# Patient Record
Sex: Male | Born: 1970 | Race: White | Hispanic: No | Marital: Married | Smoking: Current every day smoker
Health system: Southern US, Community
[De-identification: ages and names within clinical notes are randomized; demographics above are authoritative.]

## PROBLEM LIST (undated history)

## (undated) DIAGNOSIS — I639 Cerebral infarction, unspecified: Secondary | ICD-10-CM

---

## 2016-11-12 ENCOUNTER — Telehealth: Payer: Self-pay | Admitting: Cardiovascular Disease

## 2016-11-12 ENCOUNTER — Ambulatory Visit: Payer: Self-pay | Admitting: Cardiovascular Disease

## 2016-11-12 NOTE — Telephone Encounter (Signed)
11/12/16 - [facility] records [facility] for appointment on 11/12/16 @ 1:40pm with Dr. Allyson SabalBerry. [facility]. aib

## 2016-11-13 ENCOUNTER — Telehealth: Payer: Self-pay | Admitting: Cardiovascular Disease

## 2016-11-13 NOTE — Telephone Encounter (Signed)
11/13/16 - [facility] for upcoming appointment on 11/20/16 @ 1:20pm with Dr. Allyson SabalBerry. [facility] aib

## 2016-11-15 ENCOUNTER — Other Ambulatory Visit: Payer: Self-pay

## 2016-11-15 DIAGNOSIS — E876 Hypokalemia: Secondary | ICD-10-CM

## 2016-11-19 LAB — BASIC METABOLIC PANEL
BUN / CREAT RATIO: 11 (ref 9–20)
BUN: 10 mg/dL (ref 6–24)
CALCIUM: 9.4 mg/dL (ref 8.7–10.2)
CHLORIDE: 101 mmol/L (ref 96–106)
CO2: 25 mmol/L (ref 20–29)
Creatinine, Ser: 0.89 mg/dL (ref 0.76–1.27)
GFR calc non Af Amer: 102 mL/min/{1.73_m2} (ref >59)
GFR, EST AFRICAN AMERICAN: 118 mL/min/{1.73_m2} (ref >59)
GLUCOSE: 71 mg/dL (ref 65–99)
POTASSIUM: 4.5 mmol/L (ref 3.5–5.2)
Sodium: 139 mmol/L (ref 134–144)

## 2016-11-20 ENCOUNTER — Encounter: Payer: Self-pay | Admitting: Cardiovascular Disease

## 2016-11-20 ENCOUNTER — Encounter (INDEPENDENT_AMBULATORY_CARE_PROVIDER_SITE_OTHER): Payer: Self-pay

## 2016-11-20 ENCOUNTER — Ambulatory Visit (INDEPENDENT_AMBULATORY_CARE_PROVIDER_SITE_OTHER): Payer: Self-pay | Admitting: Cardiovascular Disease

## 2016-11-20 VITALS — BP 152/92 | HR 65 | Ht 67.0 in | Wt 151.2 lb

## 2016-11-20 DIAGNOSIS — R55 Syncope and collapse: Secondary | ICD-10-CM

## 2016-11-20 DIAGNOSIS — Z720 Tobacco use: Secondary | ICD-10-CM

## 2016-11-20 DIAGNOSIS — I447 Left bundle-branch block, unspecified: Secondary | ICD-10-CM

## 2016-11-20 NOTE — Assessment & Plan Note (Signed)
Mr. Phillip Rodriguez is had 2 episodes of syncope, the first one back in March , The second one on 11/02/16. These were witnessed episodes. He was unconscious for 30 of 45 seconds. There is no seizure activity. He does have left bundle branch block. I'm going to get a 2-D echocardiogram and a three-day event monitor to further evaluate. I have made him aware that he cannot drive for 6 months.

## 2016-11-20 NOTE — Assessment & Plan Note (Signed)
History tobacco abuse smoking one half pack per day for last 30 years recalcitrant 2 risk factor modification

## 2016-11-20 NOTE — Assessment & Plan Note (Signed)
Most likely chronic

## 2016-11-20 NOTE — Patient Instructions (Signed)
Medication Instructions: Your physician recommends that you continue on your current medications as directed. Please refer to the Current Medication list given to you today.    Testing/Procedures: Your physician has recommended that you wear a 30 day event monitor. Event monitors are medical devices that record the heart's electrical activity. Doctors most often us these monitors to diagnose arrhythmias. Arrhythmias are problems with the speed or rhythm of the heartbeat. The monitor is a small, portable device. You can wear one while you do your normal daily activities. This is usually used to diagnose what is causing palpitations/syncope (passing out).  Your physician has requested that you have an echocardiogram. Echocardiography is a painless test that uses sound waves to create images of your heart. It provides your doctor with information about the size and shape of your heart and how well your heart's chambers and valves are working. This procedure takes approximately one hour. There are no restrictions for this procedure.  Follow-Up: Your physician recommends that you schedule a follow-up appointment after testing with Dr. Berry.     

## 2016-11-20 NOTE — Progress Notes (Signed)
     11/20/2016 Phillip Rodriguez   February 28, 1971  [id number]  Primary Physician Gerre Pebblesavis, Sally, PA-C Primary Cardiologist: Runell GessJonathan J Berry MD Roseanne RenoFACP, FACC, FAHA, FSCAI  HPI:  Mr. Phillip Rodriguez is a pleasant 46 year old thin-appearing married Caucasian male father of 4, grandfather of 7 grandchildren is accompanied by his wife Phillip Rodriguez Today.He was referred by Dr. Sedalia Mutaox for cardiovascular evaluation because of witnessed syncope. His cardiovascular sector profile is notable for 50-pack-years of tobacco abuse smoking one and a half packs a day. He does have a family history of heart disease with the mother that died of a myocardial infarction at age 47. He never had a heart attack or stroke. He denies chest pain or shortness of breath. He has first episode back in March of this year and a second one on June 9. This was witnessed. He was unconscious for 30-45 seconds. There is no seizure activity.   No current outpatient prescriptions on file.   No current facility-administered medications for this visit.     Not on File  Social History   Social History  . Marital status: Married    Spouse name: N/A  . Number of children: N/A  . Years of education: N/A   Occupational History  . Not on file.   Social History Main Topics  . Smoking status: Current Every Day Smoker  . Smokeless tobacco: Never Used  . Alcohol use Not on file  . Drug use: Unknown  . Sexual activity: Not on file   Other Topics Concern  . Not on file   Social History Narrative  . No narrative on file     Review of Systems: General: negative for chills, fever, night sweats or weight changes.  Cardiovascular: negative for chest pain, dyspnea on exertion, edema, orthopnea, palpitations, paroxysmal nocturnal dyspnea or shortness of breath Dermatological: negative for rash Respiratory: negative for cough or wheezing Urologic: negative for hematuria Abdominal: negative for nausea, vomiting, diarrhea, bright red blood per rectum, melena, or  hematemesis Neurologic: negative for visual changes, syncope, or dizziness All other systems reviewed and are otherwise negative except as noted above.    Blood pressure (!) 152/92, pulse 65, height 5\' 7"  (1.702 m), weight 151 lb 3.2 oz (68.6 kg).  General appearance: alert and no distress Neck: no adenopathy, no carotid bruit, no JVD, supple, symmetrical, trachea midline and thyroid not enlarged, symmetric, no tenderness/mass/nodules Lungs: clear to auscultation bilaterally Heart: regular rate and rhythm, S1, S2 normal, no murmur, click, rub or gallop Extremities: extremities normal, atraumatic, no cyanosis or edema  EKG Sinus rhythm at 65 with a bundle branch block. I personally reviewed this EKG.  ASSESSMENT AND PLAN:   Tobacco abuse History tobacco abuse smoking one half pack per day for last 30 years recalcitrant 2 risk factor modification  Left bundle branch block Most likely chronic  Syncope Mr. Phillip Rodriguez is had 2 episodes of syncope, the first one back in March , The second one on 11/02/16. These were witnessed episodes. He was unconscious for 30 of 45 seconds. There is no seizure activity. He does have left bundle branch block. I'm going to get a 2-D echocardiogram and a three-day event monitor to further evaluate. I have made him aware that he cannot drive for 6 months.      Runell GessJonathan J. Berry MD FACP,FACC,FAHA, [facility] 11/20/2016 1:50 PM

## 2016-12-04 ENCOUNTER — Ambulatory Visit (HOSPITAL_COMMUNITY): Payer: Self-pay | Attending: Cardiovascular Disease

## 2016-12-04 ENCOUNTER — Other Ambulatory Visit: Payer: Self-pay

## 2016-12-04 DIAGNOSIS — I051 Rheumatic mitral insufficiency: Secondary | ICD-10-CM | POA: Insufficient documentation

## 2016-12-04 DIAGNOSIS — R55 Syncope and collapse: Secondary | ICD-10-CM

## 2016-12-18 ENCOUNTER — Ambulatory Visit: Payer: Self-pay | Admitting: Cardiovascular Disease

## 2017-02-07 ENCOUNTER — Ambulatory Visit: Payer: Self-pay | Admitting: Cardiovascular Disease

## 2017-08-08 DIAGNOSIS — I6789 Other cerebrovascular disease: Secondary | ICD-10-CM

## 2019-01-30 HISTORY — PX: THROMBECTOMY: PRO61

## 2019-09-28 HISTORY — PX: ENDARTERECTOMY: SHX5162

## 2019-12-03 ENCOUNTER — Emergency Department (HOSPITAL_COMMUNITY)
Admission: EM | Admit: 2019-12-03 | Discharge: 2019-12-04 | Disposition: A | Discharge status: Home / Self Care | Payer: BC Managed Care – PPO | Attending: Emergency Medicine | Admitting: Emergency Medicine

## 2019-12-03 ENCOUNTER — Emergency Department (HOSPITAL_COMMUNITY): Payer: BC Managed Care – PPO

## 2019-12-03 ENCOUNTER — Other Ambulatory Visit: Payer: Self-pay

## 2019-12-03 DIAGNOSIS — R404 Transient alteration of awareness: Secondary | ICD-10-CM | POA: Insufficient documentation

## 2019-12-03 DIAGNOSIS — F17210 Nicotine dependence, cigarettes, uncomplicated: Secondary | ICD-10-CM | POA: Diagnosis not present

## 2019-12-03 DIAGNOSIS — R55 Syncope and collapse: Secondary | ICD-10-CM | POA: Diagnosis present

## 2019-12-03 HISTORY — DX: Cerebral infarction, unspecified: I63.9

## 2019-12-03 MED ORDER — SODIUM CHLORIDE 0.9% FLUSH
3.0000 mL | Freq: Once | INTRAVENOUS | Status: AC
  Administered 2019-12-04: 3 mL via INTRAVENOUS

## 2019-12-03 NOTE — ED Triage Notes (Signed)
Pt BIB [facility] from home, pt's wife reports pt had a syncopal episode lasting 5 minutes. EMS reports pt A&O x 4 on their arrival. Pt diagnosed with COVID 11/17/19, pt reports he has been feeling okay until today. C/o some chest discomfort tonight before his syncopal episode, also reports some shortness of breath, but it is not different from his usual. EMS VS: BP 118/70, HR 80, CBG 168

## 2019-12-04 ENCOUNTER — Encounter (HOSPITAL_COMMUNITY): Payer: Self-pay | Admitting: Primary Care

## 2019-12-04 ENCOUNTER — Emergency Department (HOSPITAL_COMMUNITY): Payer: BC Managed Care – PPO

## 2019-12-04 DIAGNOSIS — R569 Unspecified convulsions: Secondary | ICD-10-CM

## 2019-12-04 LAB — BASIC METABOLIC PANEL
Anion gap: 7 (ref 5–15)
BUN: 14 mg/dL (ref 6–20)
CO2: 28 mmol/L (ref 22–32)
Calcium: 8.2 mg/dL — ABNORMAL LOW (ref 8.9–10.3)
Chloride: 102 mmol/L (ref 98–111)
Creatinine, Ser: 1.03 mg/dL (ref 0.61–1.24)
GFR calc Af Amer: >60 mL/min (ref >60)
GFR calc non Af Amer: >60 mL/min (ref >60)
Glucose, Bld: 125 mg/dL — ABNORMAL HIGH (ref 70–99)
Potassium: 4.2 mmol/L (ref 3.5–5.1)
Sodium: 137 mmol/L (ref 135–145)

## 2019-12-04 LAB — TROPONIN I (HIGH SENSITIVITY)
Troponin I (High Sensitivity): 6 ng/L (ref <18)
Troponin I (High Sensitivity): 8 ng/L (ref <18)

## 2019-12-04 LAB — CBC
HCT: 44.3 % (ref 39.0–52.0)
Hemoglobin: 14.6 g/dL (ref 13.0–17.0)
MCH: 30.4 pg (ref 26.0–34.0)
MCHC: 33 g/dL (ref 30.0–36.0)
MCV: 92.3 fL (ref 80.0–100.0)
Platelets: 294 10*3/uL (ref 150–400)
RBC: 4.8 MIL/uL (ref 4.22–5.81)
RDW: 15.4 % (ref 11.5–15.5)
WBC: 13.7 10*3/uL — ABNORMAL HIGH (ref 4.0–10.5)
nRBC: 0 % (ref 0.0–0.2)

## 2019-12-04 MED ORDER — LEVETIRACETAM 500 MG PO TABS
500.0000 mg | ORAL_TABLET | Freq: Two times a day (BID) | ORAL | 1 refills | Status: AC
Start: 2019-12-04 — End: ?

## 2019-12-04 MED ORDER — LEVETIRACETAM IN NACL 1000 MG/100ML IV SOLN
1000.0000 mg | Freq: Once | INTRAVENOUS | Status: AC
  Administered 2019-12-04: 1000 mg via INTRAVENOUS
  Filled 2019-12-04: qty 100

## 2019-12-04 MED ORDER — FENTANYL CITRATE (PF) 100 MCG/2ML IJ SOLN
50.0000 ug | Freq: Once | INTRAMUSCULAR | Status: AC
  Administered 2019-12-04: 50 ug via INTRAVENOUS
  Filled 2019-12-04: qty 2

## 2019-12-04 NOTE — ED Notes (Signed)
Lunch Tray Ordered @ 1039. 

## 2019-12-04 NOTE — ED Provider Notes (Signed)
[facility] EMERGENCY DEPARTMENT Provider Note   CSN: [id number] Arrival date & time: 12/03/19  2308     History Chief Complaint  Patient presents with  . Loss of Consciousness    Phillip Rodriguez is a 49 y.o. male.  The history is provided by the patient and the spouse.  Loss of Consciousness Episode history:  Single Progression:  Improving Chronicity:  New Relieved by:  None tried Worsened by:  Nothing Associated symptoms: dizziness and headaches   Associated symptoms: no fever and no shortness of breath   Patient with previous history of TIA presents with altered mental status. Patient is accompanied by his wife.  She reports that while they were at home he was sitting in a chair he appeared to lose consciousness.  She reports he never closes eyes but he was not responding.  She reports he began drooling on the right side of his mouth.  She reports his right arm began to "draw up "and he started having mild body jerking.  This resolved after 15 seconds.  Soon after he was awake and alert.  EMS arrived approximately 10 to 15 minutes later.  Initial pulse ox was low and blood pressure was also low.  This resolved soon after. No new medications.  No recent vomiting.  He reported heartburn recently after eating, but no active chest pain.  No pleurisy.  No hemoptysis.  He does report recent mild headaches.  He has had recent dizziness has been evaluated in outpatient has been referred to ENT     Patient reports he was diagnosed with COVID-19 last month, but has fully recovered  Patient Active Problem List   Diagnosis Date Noted  . Syncope 11/20/2016  . Left bundle branch block 11/20/2016  . Tobacco abuse 11/20/2016        No family history on file.  Social History   Tobacco Use  . Smoking status: Current Every Day Smoker  . Smokeless tobacco: Never Used  Substance Use Topics  . Alcohol use: Not on file  . Drug use: Not on file    Home  Medications Prior to Admission medications   Not on File    Allergies    Patient has no known allergies.  Review of Systems   Review of Systems  Constitutional: Negative for fever.  Respiratory: Negative for shortness of breath.   Cardiovascular: Positive for syncope.  Neurological: Positive for dizziness and headaches.  All other systems reviewed and are negative.   Physical Exam Updated Vital Signs BP (!) 110/94   Pulse 80   Temp 98.6 F (37 C) (Oral)   Resp 18   Ht 1.676 m (5\' 6" )   Wt 64.9 kg   SpO2 97%   BMI 23.08 kg/m   Physical Exam CONSTITUTIONAL: Well developed/well nourished HEAD: Normocephalic/atraumatic EYES: EOMI/PERRL, no nystagmus, no ptosis ENMT: Mucous membranes moist, poor dentition NECK: supple no meningeal signs CV: S1/S2 noted, no murmurs/rubs/gallops noted LUNGS: Lungs are clear to auscultation bilaterally, no apparent distress ABDOMEN: soft, nontender, no rebound or guarding GU:no cva tenderness NEURO:Awake/alert, face symmetric, no arm or leg drift is noted Cranial nerves 3/4/5/6/12/02/08/11/12 tested and intact Sensation to light touch intact in all extremities EXTREMITIES: pulses normal, full ROM SKIN: warm, color normal PSYCH: no abnormalities of mood noted  ED Results / Procedures / Treatments   Labs (all labs ordered are listed, but only abnormal results are displayed) Labs Reviewed  BASIC METABOLIC PANEL - Abnormal; Notable for the following components:  Result Value   Glucose, Bld 125 (*)    Calcium 8.2 (*)    All other components within normal limits  CBC - Abnormal; Notable for the following components:   WBC 13.7 (*)    All other components within normal limits  TROPONIN I (HIGH SENSITIVITY)  TROPONIN I (HIGH SENSITIVITY)    EKG EKG Interpretation  Date/Time:  Friday December 03 2019 23:17:06 EDT Ventricular Rate:  87 PR Interval:  140 QRS Duration: 128 QT Interval:  382 QTC Calculation: 459 R Axis:   65 Text  Interpretation: Normal sinus rhythm Left bundle branch block Abnormal ECG No previous ECGs available Confirmed by Zadie Rhine ([id number]) on 12/04/2019 5:50:07 AM   Radiology DG Chest 2 View  Result Date: 12/03/2019 CLINICAL DATA:  Syncope, recent COVID-19 diagnosis 11/17/2019 EXAM: CHEST - 2 VIEW COMPARISON:  Radiograph 08/15/2016, CT 11/29/2012 FINDINGS: Accessory azygos fissure. No consolidation, features of edema, pneumothorax, or effusion. Pulmonary vascularity is normally distributed. The cardiomediastinal contours are unremarkable. No acute osseous or soft tissue abnormality. IMPRESSION: No acute cardiopulmonary abnormality. Electronically Signed   By: Kreg Shropshire M.D.   On: 12/03/2019 23:44    Procedures Procedures   Medications Ordered in ED Medications  sodium chloride flush (NS) 0.9 % injection 3 mL (has no administration in time range)    ED Course  I have reviewed the triage vital signs and the nursing notes.  Pertinent labs & imaging results that were available during my care of the patient were reviewed by me and considered in my medical decision making (see chart for details).    MDM Rules/Calculators/A&P                          7:06 AM Patient presents with an episode of alteration of consciousness.  Wife reports that he was drooling and had some movement and jerking of his right side and arm.  It was a very brief episode.  Patient does have extensive history of cerebrovascular disease.  Care everywhere notes: "Mr.Phillip Rodriguezis a 49 y.o.Caucasianmalewith a history significant for TIAswho [facility] [place] [facility] and [facility] received IC tPA and CTA demonstrated L M1 cutoff. Transferred to [facility] for EVT with TICI 2C reperfusion and L ICA stenting with Integrilin due to intra-procedure occlusion s/p angioplasty with Dr. Allayne Stack.His last carotid ultrasound showed right ICA stenosis of 70-99%. He is s/p right CEA on  09/28/19 with Dr. Mayra Neer. Wilson." Patient's blood pressure has been stable here.  No orthostatic changes.  He ambulated without any tachypnea.  No hypoxia. Low suspicion for cardiac dysrhythmia.  No clinical signs of acute PE.  7:36 AM CT head negative.  Discussed with Dr. Amada Jupiter with neurology.  Suspicious for partial seizure.  Neurology team will evaluate the patient.  Signed out to Dr. Linwood Dibbles at shift change Final Clinical Impression(s) / ED Diagnoses Final diagnoses:  Transient alteration of awareness    Rx / DC Orders ED Discharge Orders    None       Zadie Rhine, MD 12/04/19 [phone]

## 2019-12-04 NOTE — ED Provider Notes (Signed)
Patient was assessed by Dr. Amada Jupiter, neurology.  He feels the patient is stable for discharge.  He recommends a gram of Keppra IV prior to discharge and I will write a prescription for the patient take 500 mg p.o. twice daily..  Patient plans to follow-up with [facility] urology.   Linwood Dibbles, MD 12/04/19 1036

## 2019-12-04 NOTE — ED Notes (Signed)
Pt ambulated around room. O2 sats did not drop and heart rate increased 5 BPM. Pt denies Shob or weakness

## 2019-12-04 NOTE — ED Notes (Signed)
AVS reviewed with pt who verbalized understanding. Pt ambulatory out of dept with spouse

## 2019-12-04 NOTE — Consult Note (Addendum)
NEURO HOSPITALIST CONSULT NOTE   Requesting physician: Dr. Lynelle Doctor   Reason for Consult:LOC   History obtained from:  Patient  / wife HPI:                                                                                                                                          Phillip Rodriguez is an 49 y.o. male  With PMH CVA, endarterectomy who presented to [facility] with c/o an episode of LOC  Per wife and patient. Patient has been having dizziness described as light headed sensation since about easter. He had an endarterectomy in may for his carotid stenosis, but he has still been having light headed sensations that he states have been getting worse.  Last night patient was sitting in a chair. He had not been feeling well, but he was speaking to his wife and he suddenly stopped talking mid sentence. Wife says his eyes were wide open and got big as if he was scared. She called EMS. While on the phone his right arm tensed up and began to shake. This lasted maybe 15 seconds. Whole episode maybe a minute. Then he stopped it took a few seconds but he was able to speak shortly after the shaking stopped. This has never happened before. The patient remembers not feeling well, and then coming to after EMS was called. There is a loss of time. Denies family history of seizure,  febrile seizures, or any ned medications. Endorses smoking about 1.2 pack of cigarettes per day, daily ASA 325 and occasional marijuana usage.  CTH: no hemorrhage.  Past Medical History:  Diagnosis Date  . CVA (cerebral vascular accident) [facility])     Past Surgical History:  Procedure Laterality Date  . ENDARTERECTOMY  09/28/2019  . THROMBECTOMY  01/30/2019    History reviewed. No pertinent family history.              Social History:  reports that he has been smoking. He has never used smokeless tobacco. No history on file for alcohol use and drug use.  No Known Allergies  MEDICATIONS:  No current facility-administered medications for this encounter.   Current Outpatient Medications  Medication Sig Dispense Refill  . aspirin 325 MG tablet Take 650 mg by mouth daily.    Marland Kitchen atorvastatin (LIPITOR) 40 MG tablet Take 40 mg by mouth every evening.         ROS:                                                                                                                                       ROS was performed and is negative except as noted in HPI    Blood pressure 119/78, pulse 66, temperature 98.6 F (37 C), temperature source Oral, resp. rate 18, height 5\' 6"  (1.676 m), weight 64.9 kg, SpO2 98 %.   General Examination:                                                                                                       Physical Exam  Constitutional: Appears well-developed and well-nourished.  Psych: Affect appropriate to situation Eyes: Normal external eye and conjunctiva. HENT: Normocephalic, no lesions, without obvious abnormality.   Musculoskeletal-no joint tenderness, deformity or swelling Cardiovascular: Normal rate and regular rhythm.  Respiratory: Effort normal, non-labored breathing saturations WNL GI: Soft.  No distension. There is no tenderness.  Skin: WDI  Neurological Examination Mental Status: Alert, oriented, thought content appropriate.  Speech fluent without evidence of aphasia.  Able to follow  commands without difficulty. Naming intact Cranial Nerves: II:  Visual fields grossly normal,  III,IV, VI: ptosis not present, extra-ocular motions intact bilaterally pupils equal, round, reactive to light and accommodation V,VII: smile symmetric, facial light touch sensation normal bilaterally VIII: hearing normal bilaterally IX,X: uvula rises symmetrically XI: bilateral shoulder shrug XII: midline tongue extension Motor: Right : Upper extremity   5/5 Left:      Upper extremity   5/5  Lower extremity   5/5  Lower extremity   5/5 Tone and bulk:normal tone throughout; no atrophy noted Sensory:  light touch intact throughout, bilaterally Deep Tendon Reflexes: 2+ and symmetric throughout Cerebellar: No ataxia with FNF and HTS Gait: normal gait and station   Lab Results: Basic Metabolic Panel: Recent Labs  Lab 12/03/19 2325  NA 137  K 4.2  CL 102  CO2 28  GLUCOSE 125*  BUN 14  CREATININE 1.03  CALCIUM 8.2*    CBC: Recent Labs  Lab 12/03/19 2325  WBC 13.7*  HGB 14.6  HCT 44.3  MCV 92.3  PLT  294    Imaging: DG Chest 2 View  Result Date: 12/03/2019 CLINICAL DATA:  Syncope, recent COVID-19 diagnosis 11/17/2019 EXAM: CHEST - 2 VIEW COMPARISON:  Radiograph 08/15/2016, CT 11/29/2012 FINDINGS: Accessory azygos fissure. No consolidation, features of edema, pneumothorax, or effusion. Pulmonary vascularity is normally distributed. The cardiomediastinal contours are unremarkable. No acute osseous or soft tissue abnormality. IMPRESSION: No acute cardiopulmonary abnormality. Electronically Signed   By: Kreg Shropshire M.D.   On: 12/03/2019 23:44   CT HEAD WO CONTRAST  Result Date: 12/04/2019 CLINICAL DATA:  Altered mental status EXAM: CT HEAD WITHOUT CONTRAST TECHNIQUE: Contiguous axial images were obtained from the base of the skull through the vertex without intravenous contrast. COMPARISON:  Head CT dated 06/27/2017. FINDINGS: Brain: Ventricles are stable in size and configuration. There is no mass, hemorrhage, edema or other evidence of acute parenchymal abnormality. No extra-axial hemorrhage. Vascular: Chronic calcified atherosclerotic changes of the large vessels at the skull base. No unexpected hyperdense vessel. Skull: Normal. Negative for fracture or focal lesion. Sinuses/Orbits: No acute finding. Other: None. IMPRESSION: Negative head CT. No intracranial mass, hemorrhage or edema. Electronically Signed   By: Bary Richard M.D.   On: 12/04/2019  07:23    Assessment: With PMH CVA, endarterectomy who presented to [facility] with c/o an episode of LOC. 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 Will start patient on keppra and he can f/u with outpatient neurology.   Recommendations: ---Keppra load 1000 mg, followed by keppra 500 mg BID --F/u outpatient neurology --Per [facility] statutes, patients with seizures are not allowed to drive until  they have been seizure-free for six months. Use caution when using heavy equipment or power tools. Avoid working on ladders or at heights. Take showers instead of baths. Ensure the water temperature is not too high on the home water heater. Do not go swimming alone. When caring for infants or small children, sit down when holding, feeding, or changing them to minimize risk of injury to the child in the event you have a seizure.   Also, Maintain good sleep hygiene. Avoid alcohol.    Valentina Lucks, MSN, NP-C [facility] [phone]  Attending neurologist's note to follow  I have seen the patient reviewed the above note.  The description of the event is very classical for seizure, and he is almost a year out from his previous cortical infarct.  Given the presence of clear potential seizure focus, even though this is only a single seizure I would recommend starting antiepileptic therapy at this time.  I discussed that he is not allowed to drive with the patient who expressed understanding.  Ritta Slot, MD [facility] [phone]  If 7pm- 7am, please page neurology on call as listed in AMION.  12/04/2019, 9:07 AM

## 2019-12-04 NOTE — ED Notes (Signed)
Pt to CT

## 2019-12-04 NOTE — Discharge Instructions (Addendum)
Start taking the seizure medications as prescribed.  Follow-up with a neurologist as recommended

## 2019-12-19 ENCOUNTER — Other Ambulatory Visit: Payer: Self-pay

## 2019-12-19 ENCOUNTER — Emergency Department (HOSPITAL_COMMUNITY)
Admission: EM | Admit: 2019-12-19 | Discharge: 2019-12-19 | Disposition: A | Discharge status: Home / Self Care | Payer: Self-pay | Attending: Emergency Medicine | Admitting: Emergency Medicine

## 2019-12-19 DIAGNOSIS — Z8673 Personal history of transient ischemic attack (TIA), and cerebral infarction without residual deficits: Secondary | ICD-10-CM | POA: Insufficient documentation

## 2019-12-19 DIAGNOSIS — R55 Syncope and collapse: Secondary | ICD-10-CM | POA: Insufficient documentation

## 2019-12-19 DIAGNOSIS — F17210 Nicotine dependence, cigarettes, uncomplicated: Secondary | ICD-10-CM | POA: Insufficient documentation

## 2019-12-19 DIAGNOSIS — Z7982 Long term (current) use of aspirin: Secondary | ICD-10-CM | POA: Insufficient documentation

## 2019-12-19 LAB — COMPREHENSIVE METABOLIC PANEL
ALT: 28 U/L (ref 0–44)
AST: 22 U/L (ref 15–41)
Albumin: 3.6 g/dL (ref 3.5–5.0)
Alkaline Phosphatase: 79 U/L (ref 38–126)
Anion gap: 13 (ref 5–15)
BUN: 12 mg/dL (ref 6–20)
CO2: 23 mmol/L (ref 22–32)
Calcium: 8.7 mg/dL — ABNORMAL LOW (ref 8.9–10.3)
Chloride: 105 mmol/L (ref 98–111)
Creatinine, Ser: 1.08 mg/dL (ref 0.61–1.24)
GFR calc Af Amer: >60 mL/min (ref >60)
GFR calc non Af Amer: >60 mL/min (ref >60)
Glucose, Bld: 95 mg/dL (ref 70–99)
Potassium: 4.1 mmol/L (ref 3.5–5.1)
Sodium: 141 mmol/L (ref 135–145)
Total Bilirubin: 0.5 mg/dL (ref 0.3–1.2)
Total Protein: 6.3 g/dL — ABNORMAL LOW (ref 6.5–8.1)

## 2019-12-19 LAB — CBC WITH DIFFERENTIAL/PLATELET
Abs Immature Granulocytes: 0.03 10*3/uL (ref 0.00–0.07)
Basophils Absolute: 0 10*3/uL (ref 0.0–0.1)
Basophils Relative: 0 %
Eosinophils Absolute: 0.1 10*3/uL (ref 0.0–0.5)
Eosinophils Relative: 1 %
HCT: 42.5 % (ref 39.0–52.0)
Hemoglobin: 14 g/dL (ref 13.0–17.0)
Immature Granulocytes: 0 %
Lymphocytes Relative: 17 %
Lymphs Abs: 1.4 10*3/uL (ref 0.7–4.0)
MCH: 30.8 pg (ref 26.0–34.0)
MCHC: 32.9 g/dL (ref 30.0–36.0)
MCV: 93.4 fL (ref 80.0–100.0)
Monocytes Absolute: 0.2 10*3/uL (ref 0.1–1.0)
Monocytes Relative: 2 %
Neutro Abs: 6.8 10*3/uL (ref 1.7–7.7)
Neutrophils Relative %: 80 %
Platelets: 431 10*3/uL — ABNORMAL HIGH (ref 150–400)
RBC: 4.55 MIL/uL (ref 4.22–5.81)
RDW: 15 % (ref 11.5–15.5)
WBC: 8.6 10*3/uL (ref 4.0–10.5)
nRBC: 0 % (ref 0.0–0.2)

## 2019-12-19 LAB — CBG MONITORING, ED: Glucose-Capillary: 105 mg/dL — ABNORMAL HIGH (ref 70–99)

## 2019-12-19 MED ORDER — LACTATED RINGERS IV BOLUS
1000.0000 mL | Freq: Once | INTRAVENOUS | Status: AC
  Administered 2019-12-19: 1000 mL via INTRAVENOUS

## 2019-12-19 NOTE — ED Triage Notes (Signed)
Pt presents via EMS c/o near syncopal episode while working on car outside. Denies LOC. EMS reports pt initially hypotensive however normotensive upon recheck. Did not receive and IV fluid by EMS. PT A&Ox4. Reports weakness.

## 2019-12-19 NOTE — ED Provider Notes (Signed)
[facility] EMERGENCY DEPARTMENT Provider Note   CSN: [id number] Arrival date & time: 12/19/19  1115     History Chief Complaint  Patient presents with  . Weakness    Phillip Rodriguez is a 49 y.o. male.  HPI 49 year old male presents with near syncope.  He has had episodes like this before.  He has previously had a carotid endarterectomy.  He states he chronically has lightheadedness but while he was working on his car and laying flat and repeatedly sitting up and laying back down he eventually became lightheaded to the point he thought he might pass out.  Eventually went inside.  He checked his blood pressure and it was 61/41.  Currently he is feeling better now though still little lightheaded.  He drank 2 cups of coffee today but nothing else this morning.  Has not eaten.  No chest pain, headache.  Past Medical History:  Diagnosis Date  . CVA (cerebral vascular accident) [facility])     Patient Active Problem List   Diagnosis Date Noted  . Syncope 11/20/2016  . Left bundle branch block 11/20/2016  . Tobacco abuse 11/20/2016    Past Surgical History:  Procedure Laterality Date  . ENDARTERECTOMY  09/28/2019  . THROMBECTOMY  01/30/2019       No family history on file.  Social History   Tobacco Use  . Smoking status: Current Every Day Smoker    Packs/day: 1.00    Types: Cigarettes  . Smokeless tobacco: Never Used  Substance Use Topics  . Alcohol use: Not on file  . Drug use: Not on file    Home Medications Prior to Admission medications   Medication Sig Start Date End Date Taking? Authorizing Provider  aspirin 325 MG tablet Take 650 mg by mouth daily. 09/29/19  Yes [provider]  atorvastatin (LIPITOR) 40 MG tablet Take 40 mg by mouth every evening.  09/27/19  Yes [provider]  HYDROcodone-acetaminophen (NORCO/VICODIN) 5-325 MG tablet Take 1-2 tablets by mouth every 6 (six) hours as needed for pain. 09/29/19 03/27/20 Yes [provider]  levETIRAcetam (KEPPRA) 500 MG tablet Take 1 tablet (500 mg total) by mouth 2 (two) times daily. 12/04/19  Yes Linwood Dibbles, MD    Allergies    Patient has no known allergies.  Review of Systems   Review of Systems  Cardiovascular: Negative for chest pain and palpitations.  Gastrointestinal: Positive for nausea. Negative for vomiting.  Neurological: Positive for light-headedness. Negative for syncope and headaches.  All other systems reviewed and are negative.   Physical Exam Updated Vital Signs BP 115/79 (BP Location: Left Arm)   Pulse 78   Temp 98.5 F (36.9 C) (Oral)   Resp 13   SpO2 100%   Physical Exam Vitals and nursing note reviewed.  Constitutional:      Appearance: He is well-developed.  HENT:     Head: Normocephalic and atraumatic.     Right Ear: External ear normal.     Left Ear: External ear normal.     Nose: Nose normal.     Mouth/Throat:     Mouth: Mucous membranes are dry.  Eyes:     General:        Right eye: No discharge.        Left eye: No discharge.     Extraocular Movements: Extraocular movements intact.     Pupils: Pupils are equal, round, and reactive to light.  Cardiovascular:     Rate and Rhythm:  Normal rate and regular rhythm.     Heart sounds: Normal heart sounds. No murmur heard.   Pulmonary:     Effort: Pulmonary effort is normal.     Breath sounds: Normal breath sounds.  Abdominal:     Palpations: Abdomen is soft.     Tenderness: There is no abdominal tenderness.  Musculoskeletal:     Cervical back: Neck supple.  Skin:    General: Skin is warm and dry.  Neurological:     Mental Status: He is alert and oriented to person, place, and time.     Comments: CN 3-12 grossly intact. 5/5 strength in all 4 extremities. Grossly normal sensation. Normal finger to nose.  Psychiatric:        Mood and Affect: Mood is not anxious.     ED Results / Procedures / Treatments   Labs (all labs ordered are listed, but only abnormal  results are displayed) Labs Reviewed  COMPREHENSIVE METABOLIC PANEL - Abnormal; Notable for the following components:      Result Value   Calcium 8.7 (*)    Total Protein 6.3 (*)    All other components within normal limits  CBC WITH DIFFERENTIAL/PLATELET - Abnormal; Notable for the following components:   Platelets 431 (*)    All other components within normal limits  CBG MONITORING, ED - Abnormal; Notable for the following components:   Glucose-Capillary 105 (*)    All other components within normal limits    EKG EKG Interpretation  Date/Time:  Sunday December 19 2019 14:38:42 EDT Ventricular Rate:  68 PR Interval:    QRS Duration: 136 QT Interval:  441 QTC Calculation: 469 R Axis:   74 Text Interpretation: Sinus rhythm Probable left ventricular hypertrophy Anterior Q waves, possibly due to LVH similar to December 03 2019 Confirmed by Pricilla Loveless [phone]) on 12/19/2019 2:48:33 PM   Radiology No results found.  Procedures Procedures (including critical care time)  Medications Ordered in ED Medications  lactated ringers bolus 1,000 mL (1,000 mLs Intravenous New Bag/Given 12/19/19 1226)  lactated ringers bolus 1,000 mL (1,000 mLs Intravenous New Bag/Given 12/19/19 1229)    ED Course  I have reviewed the triage vital signs and the nursing notes.  Pertinent labs & imaging results that were available during my care of the patient were reviewed by me and considered in my medical decision making (see chart for details).    MDM Rules/Calculators/A&P                          By history patient sounds dehydrated as he has only had a couple cups of coffee today with no food or drink.  He was also working outside as well.  He has been having recurrent syncopal episodes.  Labs are reviewed and unremarkable.  He is feeling better with IV fluids.  No concerning signs/symptoms such as severe headache, neuro symptoms, or chest pain.  I do not think emergent syncopal work-up is otherwise  needed in the hospital and he can follow-up with his PCP. Final Clinical Impression(s) / ED Diagnoses Final diagnoses:  Near syncope    Rx / DC Orders ED Discharge Orders    None       Pricilla Loveless, MD 12/19/19 1534

## 2022-03-20 IMAGING — CT CT HEAD W/O CM
3 series · 16 of 47 positions shown, 19 images · non-contrast
Comparison: Head CT dated 06/27/2017.

CLINICAL DATA: Altered mental status

EXAM:
CT HEAD WITHOUT CONTRAST
TECHNIQUE: Contiguous axial images were obtained from the base of the skull
through the vertex without intravenous contrast.

[Series 3: head 5.0 h30s · axial · 0.44mm/px · z∈[-121,+14]mm · 10 of 33 slices shown, 13 images]
[im 3/33  brain]
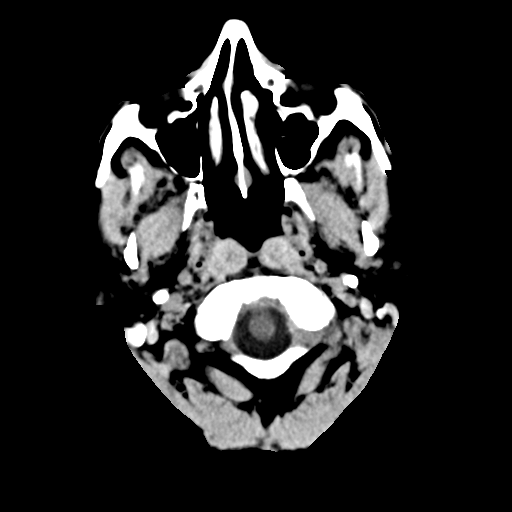
[im 3/33  bone]
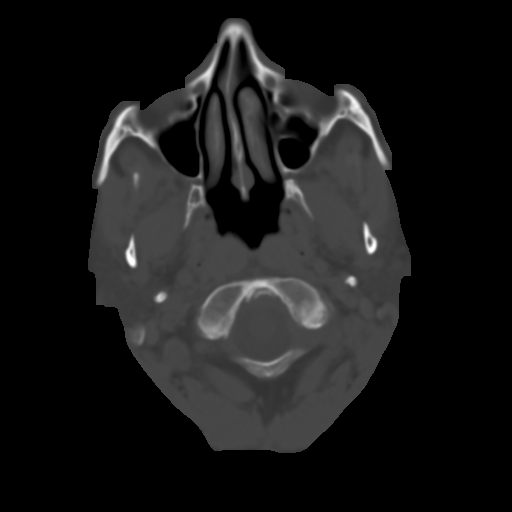
[im 6/33  brain]
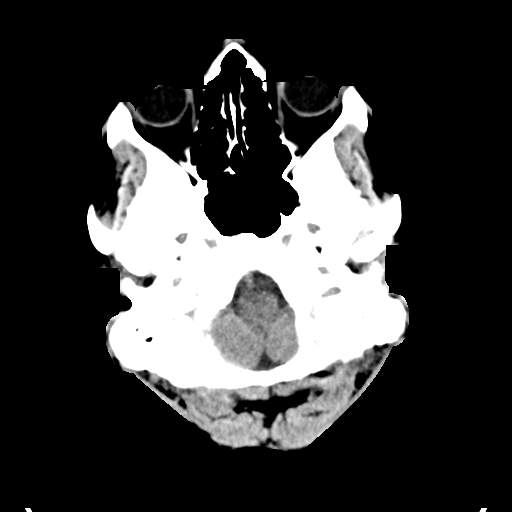
[im 9/33  brain]
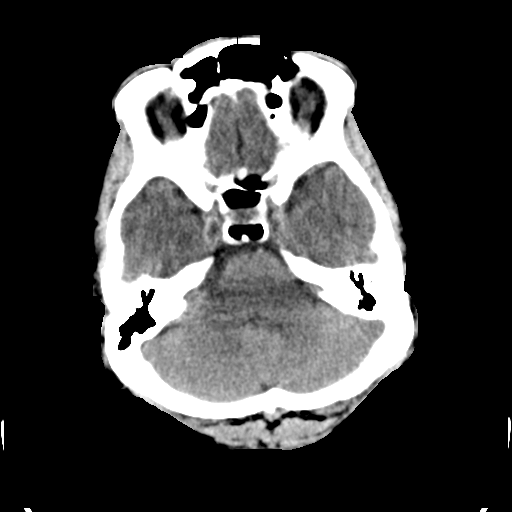
[im 12/33  brain]
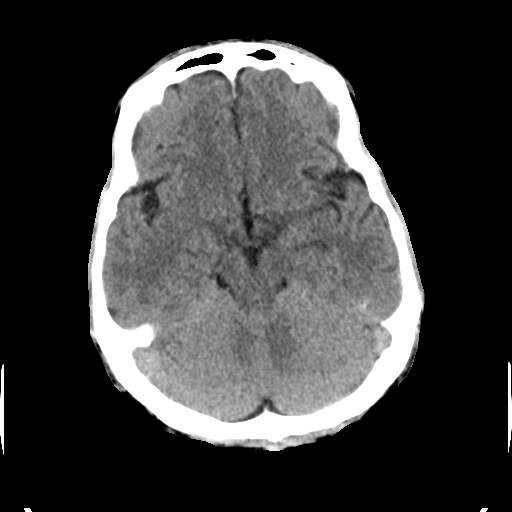
[im 15/33  brain]
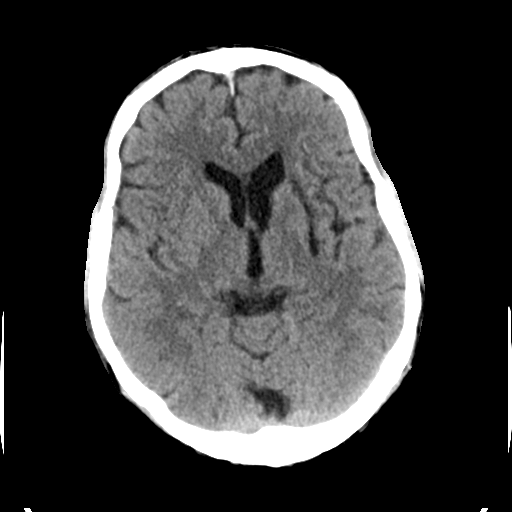
[im 15/33  bone]
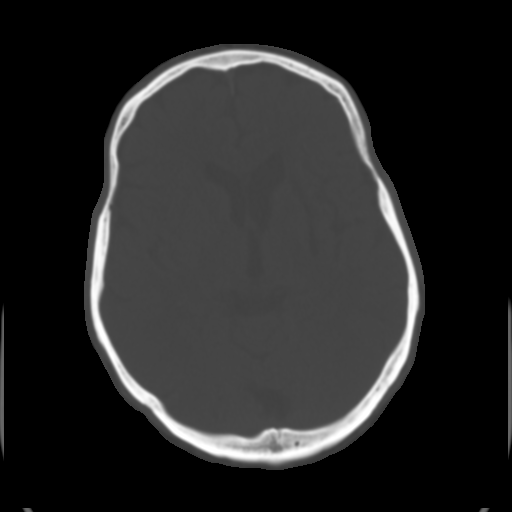
[im 18/33  brain]
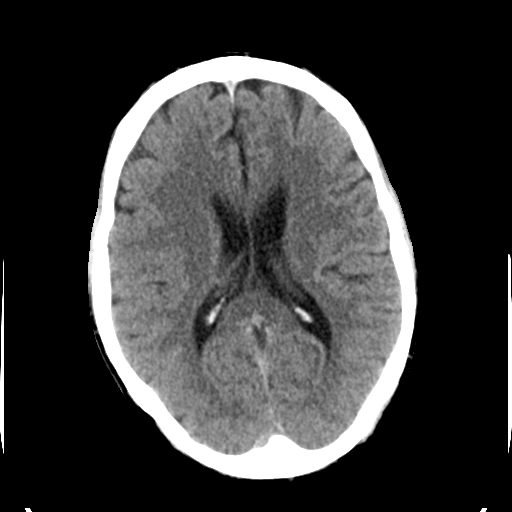
[im 21/33  brain]
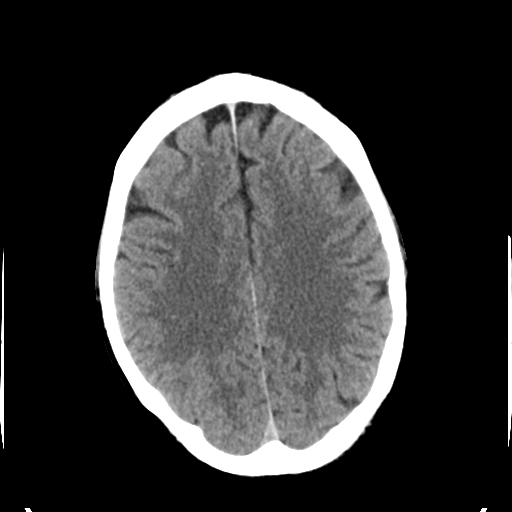
[im 25/33  brain]
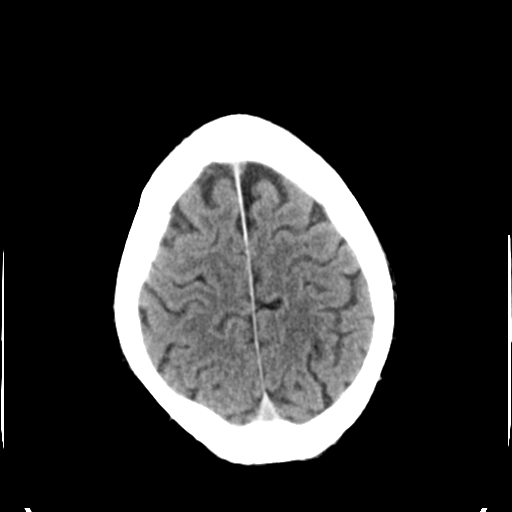
[im 27/33  brain]
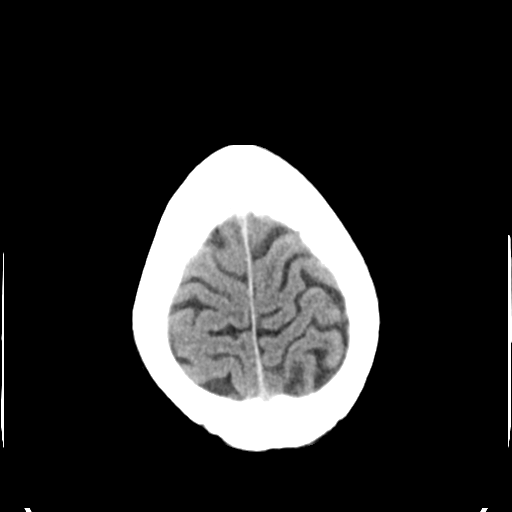
[im 27/33  bone]
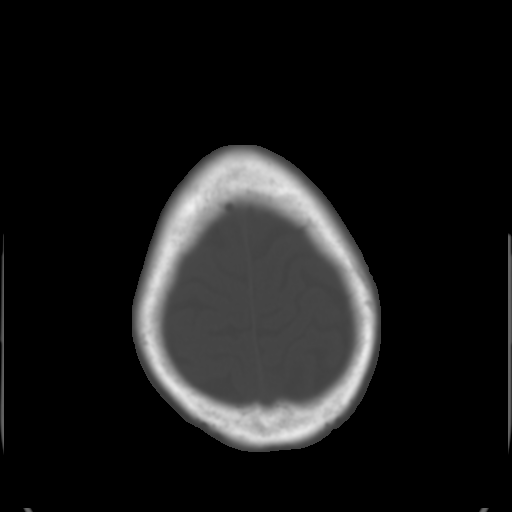
[im 30/33  brain]
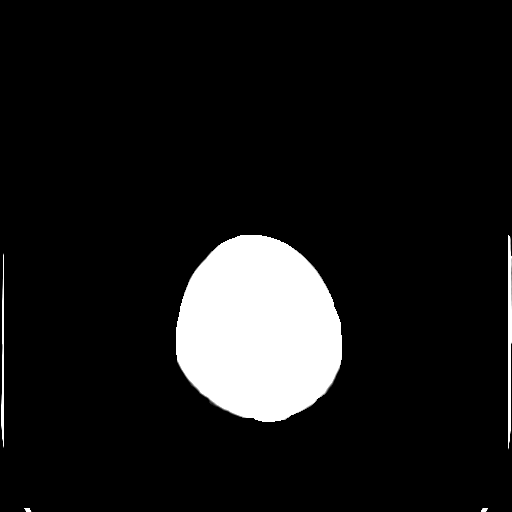

[Series 5: head 3.0 mpr cor · coronal · 0.35mm/px · 3 of 72 slices shown]
[im 24/72  brain]
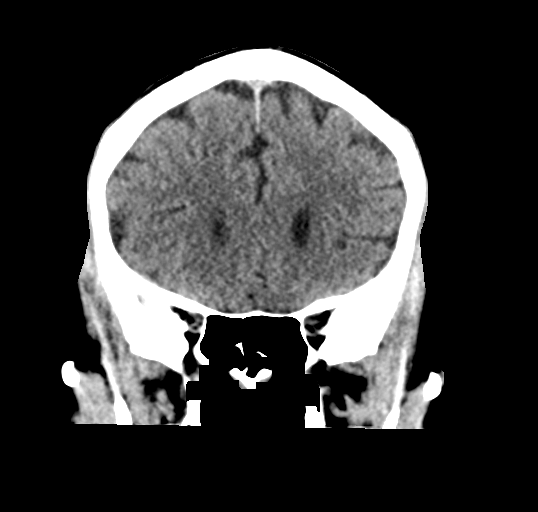
[im 32/72  brain]
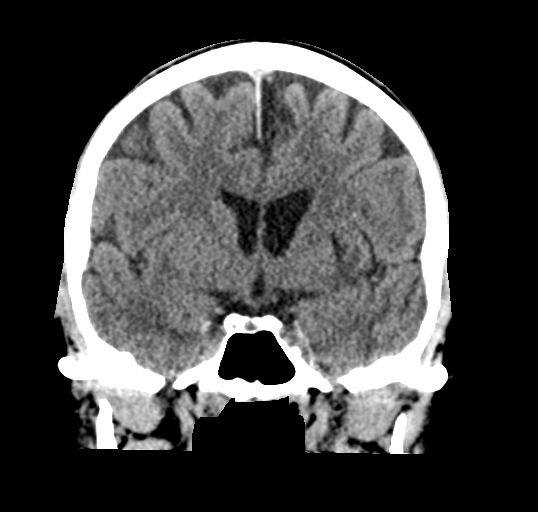
[im 40/72  brain]
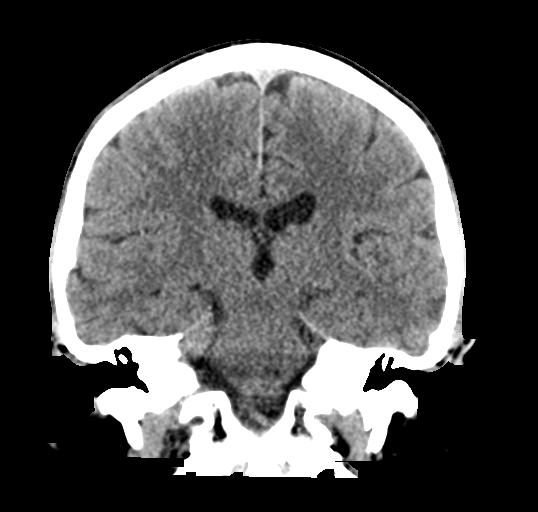

[Series 6: head 3.0 mpr sag · sagittal · 0.35mm/px · 3 of 56 slices shown]
[im 19/56  brain]
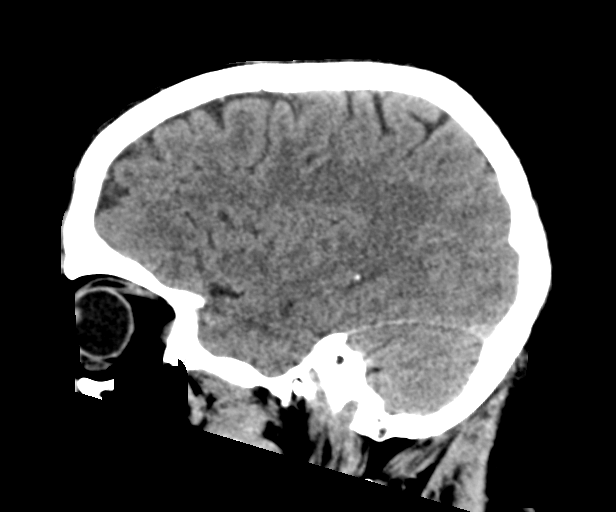
[im 28/56  brain]
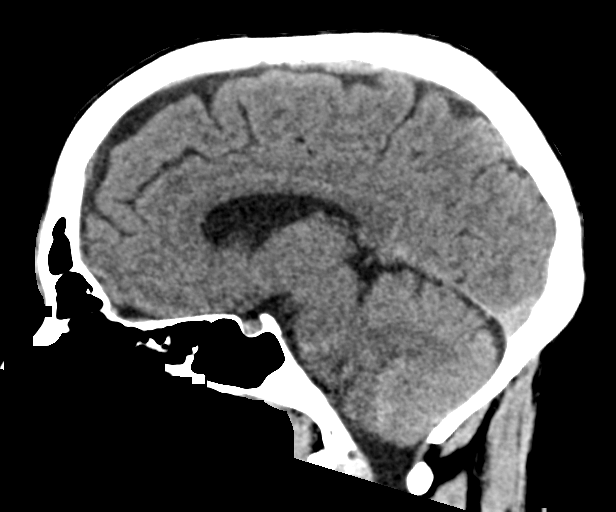
[im 37/56  brain]
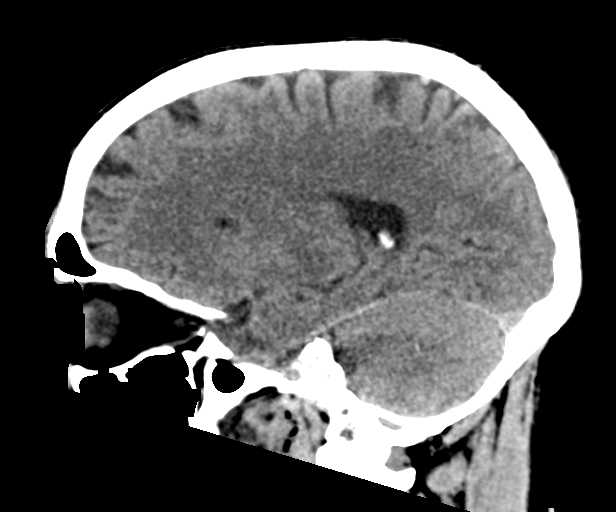

[16 of 47 positions shown; findings below may reference images not displayed]

FINDINGS: Brain: Ventricles are stable in size and configuration. There is no
mass, hemorrhage, edema or other evidence of acute parenchymal
abnormality. No extra-axial hemorrhage.

Vascular: Chronic calcified atherosclerotic changes of the large
vessels at the skull base. No unexpected hyperdense vessel.

Skull: Normal. Negative for fracture or focal lesion.

Sinuses/Orbits: No acute finding.

Other: None.
IMPRESSION: Negative head CT. No intracranial mass, hemorrhage or edema.
